# Patient Record
Sex: Female | Born: 1955 | Race: Black or African American | Hispanic: No | Marital: Married | Smoking: Never smoker
Health system: Southern US, Community
[De-identification: ages and names within clinical notes are randomized; demographics above are authoritative.]

## PROBLEM LIST (undated history)

## (undated) DIAGNOSIS — Q780 Osteogenesis imperfecta: Secondary | ICD-10-CM

---

## 2013-01-14 ENCOUNTER — Emergency Department (HOSPITAL_COMMUNITY)
Admission: EM | Admit: 2013-01-14 | Discharge: 2013-01-14 | Disposition: A | Discharge status: Home / Self Care | Payer: Worker's Compensation | Attending: Emergency Medicine | Admitting: Emergency Medicine

## 2013-01-14 ENCOUNTER — Encounter (HOSPITAL_COMMUNITY): Payer: Self-pay | Admitting: *Deleted

## 2013-01-14 ENCOUNTER — Emergency Department (HOSPITAL_COMMUNITY): Payer: Worker's Compensation

## 2013-01-14 DIAGNOSIS — W010XXA Fall on same level from slipping, tripping and stumbling without subsequent striking against object, initial encounter: Secondary | ICD-10-CM | POA: Insufficient documentation

## 2013-01-14 DIAGNOSIS — Z87798 Personal history of other (corrected) congenital malformations: Secondary | ICD-10-CM | POA: Insufficient documentation

## 2013-01-14 DIAGNOSIS — S62109A Fracture of unspecified carpal bone, unspecified wrist, initial encounter for closed fracture: Secondary | ICD-10-CM | POA: Insufficient documentation

## 2013-01-14 DIAGNOSIS — S62101A Fracture of unspecified carpal bone, right wrist, initial encounter for closed fracture: Secondary | ICD-10-CM

## 2013-01-14 DIAGNOSIS — Y9289 Other specified places as the place of occurrence of the external cause: Secondary | ICD-10-CM | POA: Insufficient documentation

## 2013-01-14 DIAGNOSIS — Y9389 Activity, other specified: Secondary | ICD-10-CM | POA: Insufficient documentation

## 2013-01-14 HISTORY — DX: Osteogenesis imperfecta: Q78.0

## 2013-01-14 MED ORDER — ONDANSETRON HCL 4 MG PO TABS: 4.0000 mg | ORAL_TABLET | Freq: Three times a day (TID) | ORAL | Status: AC | PRN

## 2013-01-14 MED ORDER — OXYCODONE-ACETAMINOPHEN 5-325 MG PO TABS
2.0000 | ORAL_TABLET | Freq: Once | ORAL | Status: AC
  Administered 2013-01-14: 2 via ORAL
  Filled 2013-01-14: qty 2

## 2013-01-14 MED ORDER — ONDANSETRON 4 MG PO TBDP
4.0000 mg | ORAL_TABLET | Freq: Once | ORAL | Status: AC
  Administered 2013-01-14: 4 mg via ORAL
  Filled 2013-01-14: qty 1

## 2013-01-14 MED ORDER — OXYCODONE-ACETAMINOPHEN 5-325 MG PO TABS: 2.0000 | ORAL_TABLET | Freq: Four times a day (QID) | ORAL | Status: AC | PRN

## 2013-01-14 NOTE — ED Notes (Signed)
Patient transported to X-ray 

## 2013-01-14 NOTE — ED Notes (Signed)
Pt slipped and fell on ice and is here for evaluation of pain and swelling in right wrist.

## 2013-01-14 NOTE — ED Provider Notes (Signed)
Medical screening examination/treatment/procedure(s) were performed by non-physician practitioner and as supervising physician I was immediately available for consultation/collaboration.  Ankit Nanavati, MD 01/14/13 1447 

## 2013-01-14 NOTE — ED Notes (Signed)
Ortho tech at bedside 

## 2013-01-14 NOTE — ED Provider Notes (Signed)
History     CSN: [id number]  Arrival date & time 01/14/13  1053   First MD Initiated Contact with Patient 01/14/13 1101      Chief Complaint  Patient presents with  . Wrist Pain    (Consider location/radiation/quality/duration/timing/severity/associated sxs/prior treatment) HPI Comments: This is a 6 rolled female, past medical history remarkable for osteogenesis imperfecta, who presents emergency department with chief complaint of right wrist pain. Patient states that she slipped and fell on the ice approximately 2 hours ago. She endorses 10 out of 10 pain. States the pain does not radiate. She denies any numbness or tingling. She states that nothing makes her symptoms better or worse. She has not tried anything to alleviate her symptoms.  The history is provided by the patient. No language interpreter was used.    Past Medical History  Diagnosis Date  . Osteogenesis imperfecta     History reviewed. No pertinent past surgical history.  No family history on file.  History  Substance Use Topics  . Smoking status: Never Smoker   . Smokeless tobacco: Not on file  . Alcohol Use: No    OB History   Grav Para Term Preterm Abortions TAB SAB Ect Mult Living                  Review of Systems  All other systems reviewed and are negative.    Allergies  Review of patient's allergies indicates no known allergies.  Home Medications   Current Outpatient Rx  Name  Route  Sig  Dispense  Refill  . Fexofenadine HCl (ALLEGRA ALLERGY PO)   Oral   Take 1 tablet by mouth daily.         Marland Kitchen OVER THE COUNTER MEDICATION   Oral   Take 1 tablet by mouth daily as needed. For allergies/itching           BP 154/88  Pulse 71  Temp(Src) 97.9 F (36.6 C) (Oral)  Resp 22  SpO2 100%  Physical Exam  Nursing note and vitals reviewed. Constitutional: She is oriented to person, place, and time. She appears well-developed and well-nourished.  HENT:  Head: Normocephalic and  atraumatic.  Eyes: Conjunctivae and EOM are normal.  Neck: Normal range of motion.  Cardiovascular: Normal rate and intact distal pulses.   2+ radial pulses, brisk capillary refill  Pulmonary/Chest: Effort normal.  Abdominal: She exhibits no distension.  Musculoskeletal: Normal range of motion. She exhibits edema and tenderness.  Swelling of the right wrist is moderate, tenderness to palpation, range of motion and strength deferred secondary to pain  Neurological: She is alert and oriented to person, place, and time.  Sensation intact in the distal right hand and fingers  Skin: Skin is dry.  Psychiatric: She has a normal mood and affect. Her behavior is normal. Judgment and thought content normal.    ED Course  Procedures (including critical care time)  Labs Reviewed - No data to display Dg Wrist Complete Right  01/14/2013  *RADIOLOGY REPORT*  Clinical Data: Wrist pain post fall  RIGHT WRIST - COMPLETE 3+ VIEW  Comparison: None  Findings: Four views of the right wrist submitted.  Diffuse osteopenia.  Minimal displaced fracture of distal right radial metaphysis.  IMPRESSION: Minimal displaced fracture of distal right radial metaphysis. Diffuse osteopenia.   Original Report Authenticated By: Natasha Mead, M.D.      1. Wrist fracture, right, closed, initial encounter       MDM  57 year old female with right  wrist fracture. Will splint the patient with a sugar tong splint, place the patient in a sling, and give the patient pain medicine. Will have the patient followup with orthopedics. Patient understands and agrees with the plan. She is stable and ready for discharge.        Roxy Horseman, PA-C 01/14/13 1142

## 2013-01-14 NOTE — Progress Notes (Signed)
Orthopedic Tech Progress Note Patient Details:  Barbara Woodard 1955/11/28 [id number] Sugartong splint applied to Right UE with assistance. Arm sling applied. Tolerated well.  Ortho Devices Type of Ortho Device: Sugartong splint;Arm sling Ortho Device/Splint Interventions: Application   Asia R Thompson 01/14/2013, 12:11 PM

## 2014-02-20 IMAGING — CR DG WRIST COMPLETE 3+V*R*
4 series · 4 of 4 positions shown · non-contrast
Comparison: None

CLINICAL DATA: Wrist pain post fall

RIGHT WRIST - COMPLETE 3+ VIEW

[x wrist pa right]
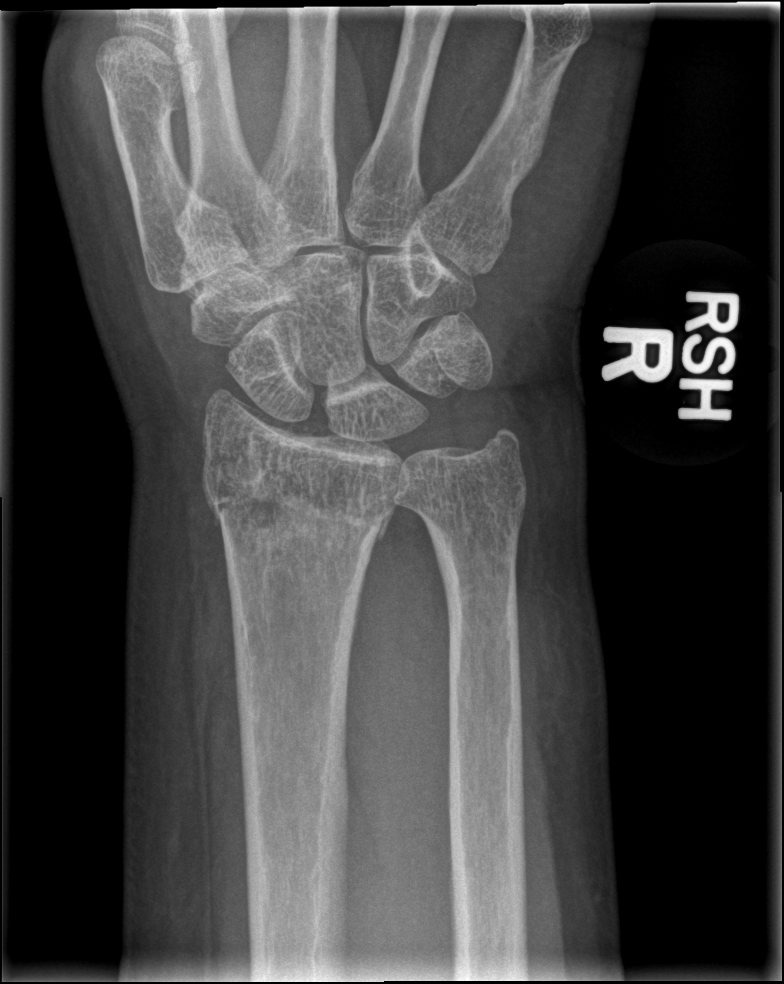

[x wrist obl right]
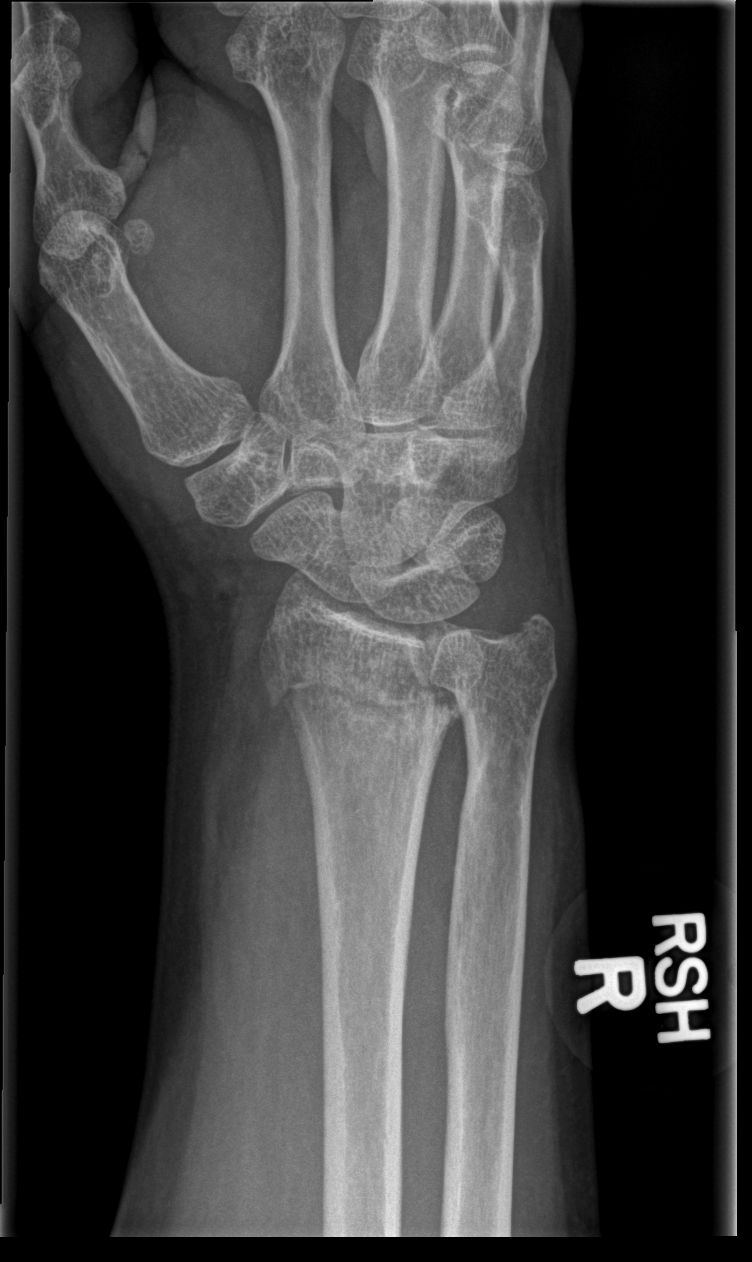

[x wrist lat right]
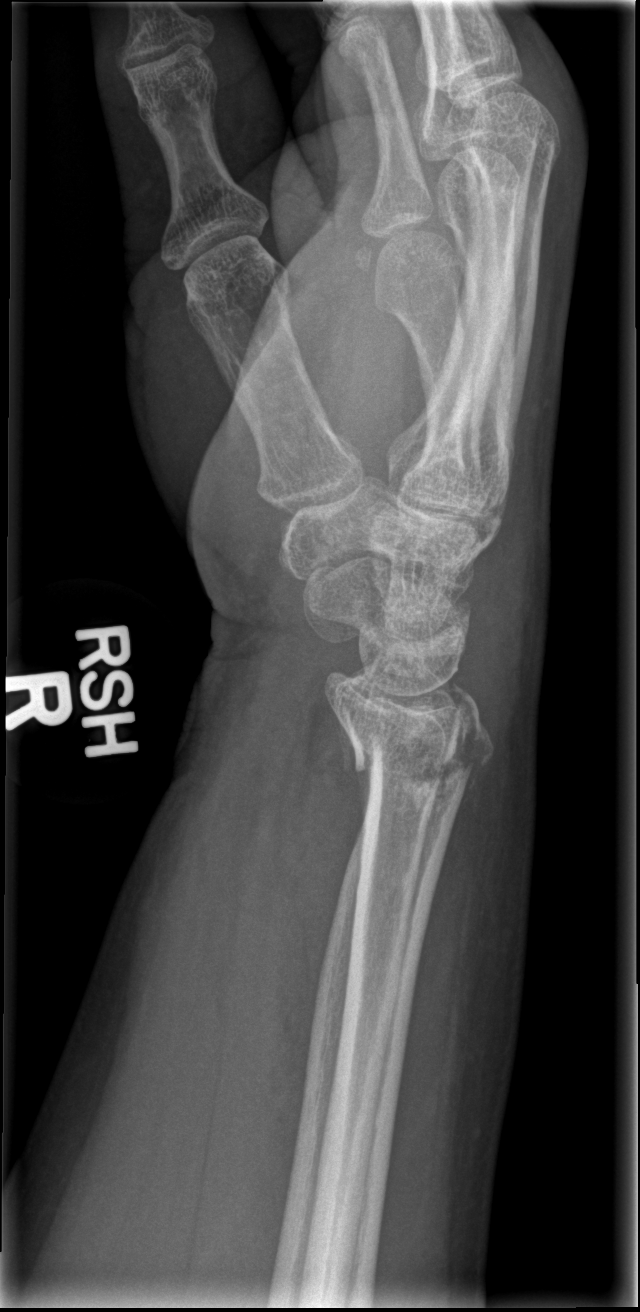

[x wrist navicular view right]
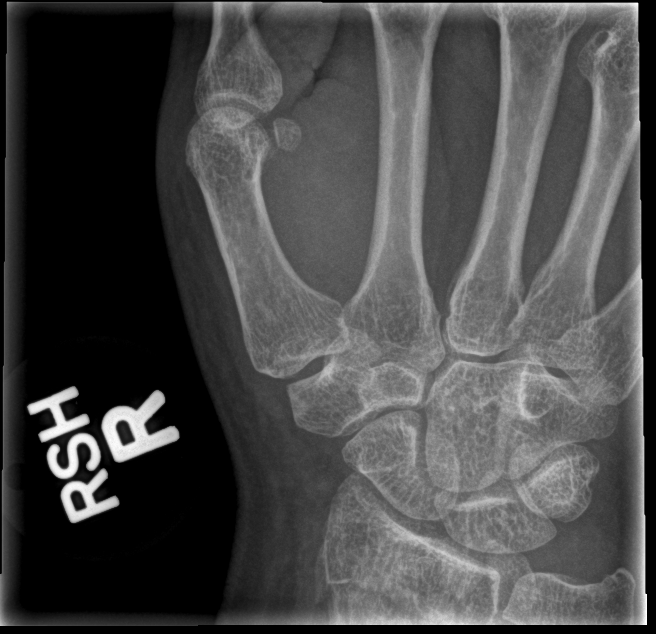

[4 of 4 positions shown; findings below may reference images not displayed]

FINDINGS: Four views of the right wrist submitted.  Diffuse
osteopenia.  Minimal displaced fracture of distal right radial
metaphysis.
IMPRESSION: Minimal displaced fracture of distal right radial metaphysis.
Diffuse osteopenia.

## 2017-12-21 ENCOUNTER — Telehealth: Payer: Self-pay | Admitting: *Deleted

## 2017-12-21 NOTE — Telephone Encounter (Signed)
Entered in error
